# Patient Record
Sex: Female | Born: 1995 | Race: Black or African American | Hispanic: No | Marital: Single | Smoking: Never smoker
Health system: Southern US, Community
[De-identification: ages and names within clinical notes are randomized; demographics above are authoritative.]

---
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
## 2015-03-14 ENCOUNTER — Emergency Department (HOSPITAL_COMMUNITY)
Admission: EM | Admit: 2015-03-14 | Discharge: 2015-03-14 | Disposition: A | Discharge status: Home / Self Care | Payer: Medicaid Other | Attending: Emergency Medicine | Admitting: Emergency Medicine

## 2015-03-14 ENCOUNTER — Encounter (HOSPITAL_COMMUNITY): Payer: Self-pay | Admitting: Emergency Medicine

## 2015-03-14 DIAGNOSIS — N390 Urinary tract infection, site not specified: Secondary | ICD-10-CM | POA: Insufficient documentation

## 2015-03-14 DIAGNOSIS — Z3202 Encounter for pregnancy test, result negative: Secondary | ICD-10-CM | POA: Diagnosis not present

## 2015-03-14 DIAGNOSIS — R300 Dysuria: Secondary | ICD-10-CM | POA: Diagnosis present

## 2015-03-14 LAB — CBC
HEMATOCRIT: 42.6 % (ref 36.0–46.0)
HEMOGLOBIN: 14.4 g/dL (ref 12.0–15.0)
MCH: 31.2 pg (ref 26.0–34.0)
MCHC: 33.8 g/dL (ref 30.0–36.0)
MCV: 92.4 fL (ref 78.0–100.0)
Platelets: 228 10*3/uL (ref 150–400)
RBC: 4.61 MIL/uL (ref 3.87–5.11)
RDW: 13.2 % (ref 11.5–15.5)
WBC: 10.4 10*3/uL (ref 4.0–10.5)

## 2015-03-14 LAB — COMPREHENSIVE METABOLIC PANEL
ALK PHOS: 71 U/L (ref 38–126)
ALT: 19 U/L (ref 14–54)
ANION GAP: 12 (ref 5–15)
AST: 22 U/L (ref 15–41)
Albumin: 4.8 g/dL (ref 3.5–5.0)
BILIRUBIN TOTAL: 0.6 mg/dL (ref 0.3–1.2)
BUN: 9 mg/dL (ref 6–20)
CALCIUM: 9.9 mg/dL (ref 8.9–10.3)
CO2: 22 mmol/L (ref 22–32)
Chloride: 108 mmol/L (ref 101–111)
Creatinine, Ser: 0.58 mg/dL (ref 0.44–1.00)
GFR calc non Af Amer: >60 mL/min (ref >60)
Glucose, Bld: 77 mg/dL (ref 65–99)
POTASSIUM: 3.6 mmol/L (ref 3.5–5.1)
SODIUM: 142 mmol/L (ref 135–145)
TOTAL PROTEIN: 7.9 g/dL (ref 6.5–8.1)

## 2015-03-14 LAB — URINE MICROSCOPIC-ADD ON

## 2015-03-14 LAB — URINALYSIS, ROUTINE W REFLEX MICROSCOPIC
Glucose, UA: NEGATIVE mg/dL
KETONES UR: 40 mg/dL — AB
NITRITE: POSITIVE — AB
Protein, ur: 30 mg/dL — AB
SPECIFIC GRAVITY, URINE: 1.018 (ref 1.005–1.030)
pH: 6 (ref 5.0–8.0)

## 2015-03-14 LAB — LIPASE, BLOOD: Lipase: 23 U/L (ref 11–51)

## 2015-03-14 LAB — I-STAT BETA HCG BLOOD, ED (MC, WL, AP ONLY): I-stat hCG, quantitative: <5 m[IU]/mL (ref <5)

## 2015-03-14 MED ORDER — CEPHALEXIN 500 MG PO CAPS
500.0000 mg | ORAL_CAPSULE | Freq: Once | ORAL | Status: AC
  Administered 2015-03-14: 500 mg via ORAL
  Filled 2015-03-14: qty 1

## 2015-03-14 MED ORDER — PHENAZOPYRIDINE HCL 100 MG PO TABS
100.0000 mg | ORAL_TABLET | Freq: Three times a day (TID) | ORAL | Status: DC
  Administered 2015-03-14: 100 mg via ORAL
  Filled 2015-03-14: qty 1

## 2015-03-14 MED ORDER — PHENAZOPYRIDINE HCL 200 MG PO TABS: 200.0000 mg | ORAL_TABLET | Freq: Three times a day (TID) | ORAL | Status: AC

## 2015-03-14 MED ORDER — CEPHALEXIN 500 MG PO CAPS: 500.0000 mg | ORAL_CAPSULE | Freq: Two times a day (BID) | ORAL | Status: AC

## 2015-03-14 NOTE — ED Notes (Signed)
Patient presents for generalized abdominal pain, dysuria, hematuria x1 day. Reports one episode of emesis earlier today, possibly r/t AZO. Denies nausea currently, denies fever/chills.

## 2015-03-14 NOTE — ED Provider Notes (Signed)
CSN: [id number]     Arrival date & time 03/14/15  1904 History   First MD Initiated Contact with Patient 03/14/15 1940     Chief Complaint  Patient presents with  . Dysuria  . Hematuria   (Consider location/radiation/quality/duration/timing/severity/associated sxs/prior Treatment) Patient is a 20 y.o. female presenting with dysuria and hematuria. The history is provided by the patient. No language interpreter was used.  Dysuria Hematuria    Anita Reeves is a 20 y.o female with past medical history of Chlamydia who presents for urinary frequency and inability to fully empty her bladder, dysuria, and hematuria since yesterday. She denies having these symptoms before. She took Azo's but vomited once. She states that she took it on an empty stomach. Currently on depo and has not had a period in almost a year. Is sexually active. She denies any fever, chills, abdominal pain, back pain, nausea, diarrhea, vaginal bleeding or discharge, vaginal odor.   History reviewed. No pertinent past medical history. History reviewed. No pertinent past surgical history. No family history on file. Social History  Substance Use Topics  . Smoking status: Never Smoker   . Smokeless tobacco: None  . Alcohol Use: No   OB History    No data available     Review of Systems  Genitourinary: Positive for dysuria and hematuria.  All other systems reviewed and are negative.     Allergies  Review of patient's allergies indicates no known allergies.  Home Medications   Prior to Admission medications   Medication Sig Start Date End Date Taking? Authorizing Provider  cephALEXin (KEFLEX) 500 MG capsule Take 1 capsule (500 mg total) by mouth 2 (two) times daily. 03/14/15 03/20/15  Hyman Crossan Patel-Mills, PA-C  medroxyPROGESTERone (DEPO-PROVERA) 150 MG/ML injection Inject 150 mg into the muscle every 3 (three) months.  01/19/15   Historical Provider, MD  phenazopyridine (PYRIDIUM) 200 MG tablet Take 1 tablet (200 mg total)  by mouth 3 (three) times daily. 03/14/15   Venancio Chenier Patel-Mills, PA-C   BP 125/84 mmHg  Pulse 99  Temp(Src) 97.8 F (36.6 C) (Oral)  Resp 17  Ht  (1.575 m)  Wt 58.06 kg  BMI 23.41 kg/m2  SpO2 100% Physical Exam  Constitutional: She is oriented to person, place, and time. She appears well-developed and well-nourished.  HENT:  Head: Normocephalic and atraumatic.  Eyes: Conjunctivae are normal.  Neck: Normal range of motion. Neck supple.  Cardiovascular: Normal rate, regular rhythm and normal heart sounds.   Regular rate and rhythm. No murmur.  Pulmonary/Chest: Effort normal and breath sounds normal. No respiratory distress.  Lungs clear to auscultation bilaterally.  Abdominal: Soft. Normal appearance. She exhibits no distension. There is no tenderness. There is no rebound, no guarding and no CVA tenderness.  No abdominal tenderness to palpation. Note distention. No CVA tenderness. No peritoneal signs. Normal-appearing abdomen.  Musculoskeletal: Normal range of motion.  Neurological: She is alert and oriented to person, place, and time.  Skin: Skin is warm and dry.  Nursing note and vitals reviewed.   ED Course  Procedures (including critical care time) Labs Review Labs Reviewed  URINALYSIS, ROUTINE W REFLEX MICROSCOPIC (NOT AT [facility]) - Abnormal; Notable for the following:    Color, Urine ORANGE (*)    APPearance CLOUDY (*)    Hgb urine dipstick LARGE (*)    Bilirubin Urine SMALL (*)    Ketones, ur 40 (*)    Protein, ur 30 (*)    Nitrite POSITIVE (*)    Leukocytes,  UA MODERATE (*)    All other components within normal limits  URINE MICROSCOPIC-ADD ON - Abnormal; Notable for the following:    Squamous Epithelial / LPF 0-5 (*)    Bacteria, UA FEW (*)    All other components within normal limits  URINE CULTURE  LIPASE, BLOOD  COMPREHENSIVE METABOLIC PANEL  CBC  I-STAT BETA HCG BLOOD, ED ([facility], [facility], [facility] ONLY)    Imaging Review No results found. I have personally reviewed  and evaluated these lab results as part of my medical decision-making.   EKG Interpretation None      MDM   Final diagnoses:  Acute UTI   Patient presents for dysuria, hematuria, and inability to empty bladder fully. Denies any vaginal bleeding or discharge. Denies vaginal odor or itching. Last menstrual period was over a year ago because patient is on depo provera. I do not believe she needs a pelvic exam at this time. She has no signs of pyelonephritis. No leukocytosis or fever. No back pain or nausea. She is well-appearing and in no acute distress. She is not pregnant CBC and electrolytes are normal. Lipase normal. Urinalysis reveals nitrates and moderate leukocytes. Urine culture pending. Medications  phenazopyridine (PYRIDIUM) tablet 100 mg (100 mg Oral Given 03/14/15 2028)  cephALEXin (KEFLEX) capsule 500 mg (500 mg Oral Given 03/14/15 2028)   I discussed taking clear fluids. Return precautions were also discussed. She was given follow-up and patient agrees with plan. Filed Vitals:   03/14/15 2106 03/14/15 2122  BP: 119/79 125/84  Pulse: 100 99  Temp: 98 F (36.7 C) 97.8 F (36.6 C)  Resp: 16 [address], PA-C 03/15/15 0005  Gerhard Munch, MD 03/19/15 [phone]

## 2015-03-14 NOTE — Discharge Instructions (Signed)
Urinary Tract Infection Follow-up with his primary care physician. Return for fever, back pain, vomiting, or no relief of symptoms. A urinary tract infection (UTI) can occur any place along the urinary tract. The tract includes the kidneys, ureters, bladder, and urethra. A type of germ called bacteria often causes a UTI. UTIs are often helped with antibiotic medicine.  HOME CARE   If given, take antibiotics as told by your doctor. Finish them even if you start to feel better.  Drink enough fluids to keep your pee (urine) clear or pale yellow.  Avoid tea, drinks with caffeine, and bubbly (carbonated) drinks.  Pee often. Avoid holding your pee in for a long time.  Pee before and after having sex (intercourse).  Wipe from front to back after you poop (bowel movement) if you are a woman. Use each tissue only once. GET HELP RIGHT AWAY IF:   You have back pain.  You have lower belly (abdominal) pain.  You have chills.  You feel sick to your stomach (nauseous).  You throw up (vomit).  Your burning or discomfort with peeing does not go away.  You have a fever.  Your symptoms are not better in 3 days. MAKE SURE YOU:   Understand these instructions.  Will watch your condition.  Will get help right away if you are not doing well or get worse.   This information is not intended to replace advice given to you by your health care provider. Make sure you discuss any questions you have with your health care provider.   Document Released: 07/16/2007 Document Revised: 02/17/2014 Document Reviewed: 08/28/2011 Elsevier Interactive Patient Education [employer]

## 2015-03-16 LAB — URINE CULTURE: Special Requests: NORMAL

## 2015-11-08 ENCOUNTER — Emergency Department (HOSPITAL_COMMUNITY)
Admission: EM | Admit: 2015-11-08 | Discharge: 2015-11-08 | Disposition: A | Discharge status: Home / Self Care | Payer: Medicaid Other | Attending: Emergency Medicine | Admitting: Emergency Medicine

## 2015-11-08 ENCOUNTER — Emergency Department (HOSPITAL_COMMUNITY): Payer: Medicaid Other

## 2015-11-08 ENCOUNTER — Encounter (HOSPITAL_COMMUNITY): Payer: Self-pay | Admitting: Emergency Medicine

## 2015-11-08 DIAGNOSIS — S39012A Strain of muscle, fascia and tendon of lower back, initial encounter: Secondary | ICD-10-CM | POA: Diagnosis not present

## 2015-11-08 DIAGNOSIS — Y999 Unspecified external cause status: Secondary | ICD-10-CM | POA: Diagnosis not present

## 2015-11-08 DIAGNOSIS — Y939 Activity, unspecified: Secondary | ICD-10-CM | POA: Insufficient documentation

## 2015-11-08 DIAGNOSIS — Y92410 Unspecified street and highway as the place of occurrence of the external cause: Secondary | ICD-10-CM | POA: Diagnosis not present

## 2015-11-08 DIAGNOSIS — S161XXA Strain of muscle, fascia and tendon at neck level, initial encounter: Secondary | ICD-10-CM | POA: Insufficient documentation

## 2015-11-08 DIAGNOSIS — S199XXA Unspecified injury of neck, initial encounter: Secondary | ICD-10-CM | POA: Diagnosis present

## 2015-11-08 MED ORDER — CYCLOBENZAPRINE HCL 10 MG PO TABS: 10.0000 mg | ORAL_TABLET | Freq: Two times a day (BID) | ORAL | 0 refills | Status: AC | PRN

## 2015-11-08 MED ORDER — HYDROCODONE-ACETAMINOPHEN 5-325 MG PO TABS: 1.0000 | ORAL_TABLET | Freq: Four times a day (QID) | ORAL | 0 refills | Status: AC | PRN

## 2015-11-08 MED ORDER — HYDROCODONE-ACETAMINOPHEN 5-325 MG PO TABS
2.0000 | ORAL_TABLET | Freq: Once | ORAL | Status: AC
  Administered 2015-11-08: 2 via ORAL
  Filled 2015-11-08: qty 2

## 2015-11-08 MED ORDER — IBUPROFEN 800 MG PO TABS: 800.0000 mg | ORAL_TABLET | Freq: Three times a day (TID) | ORAL | 0 refills | Status: AC
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
## 2017-07-28 DIAGNOSIS — Z0000 Encounter for general adult medical examination without abnormal findings: Secondary | ICD-10-CM | POA: Diagnosis not present

## 2017-07-28 DIAGNOSIS — Z3042 Encounter for surveillance of injectable contraceptive: Secondary | ICD-10-CM | POA: Diagnosis not present

## 2020-01-30 DIAGNOSIS — Z309 Encounter for contraceptive management, unspecified: Secondary | ICD-10-CM | POA: Diagnosis not present

## 2020-01-30 DIAGNOSIS — Z0000 Encounter for general adult medical examination without abnormal findings: Secondary | ICD-10-CM | POA: Diagnosis not present

## 2020-01-30 DIAGNOSIS — Z124 Encounter for screening for malignant neoplasm of cervix: Secondary | ICD-10-CM | POA: Diagnosis not present

## 2020-02-09 DIAGNOSIS — Z3042 Encounter for surveillance of injectable contraceptive: Secondary | ICD-10-CM | POA: Diagnosis not present

## 2020-08-02 DIAGNOSIS — Z3042 Encounter for surveillance of injectable contraceptive: Secondary | ICD-10-CM | POA: Diagnosis not present

## 2020-08-02 DIAGNOSIS — Z309 Encounter for contraceptive management, unspecified: Secondary | ICD-10-CM | POA: Diagnosis not present

## 2021-01-30 DIAGNOSIS — Z0000 Encounter for general adult medical examination without abnormal findings: Secondary | ICD-10-CM | POA: Diagnosis not present

## 2021-01-30 DIAGNOSIS — Z13220 Encounter for screening for lipoid disorders: Secondary | ICD-10-CM | POA: Diagnosis not present

## 2021-01-30 DIAGNOSIS — Z131 Encounter for screening for diabetes mellitus: Secondary | ICD-10-CM | POA: Diagnosis not present
